# Patient Record
Sex: Male | Born: 1990 | Race: White | Hispanic: No | Marital: Single | Smoking: Never smoker
Health system: Southern US, Community
[De-identification: ages and names within clinical notes are randomized; demographics above are authoritative.]

---
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
## 2010-11-08 ENCOUNTER — Ambulatory Visit: Payer: Self-pay | Admitting: Internal Medicine

## 2014-02-02 ENCOUNTER — Ambulatory Visit: Payer: Self-pay | Admitting: Physician Assistant

## 2017-03-13 ENCOUNTER — Encounter: Payer: Self-pay | Admitting: Gynecology

## 2017-03-13 ENCOUNTER — Ambulatory Visit
Admission: EM | Admit: 2017-03-13 | Discharge: 2017-03-13 | Disposition: A | Discharge status: Home / Self Care | Payer: 59 | Attending: Internal Medicine | Admitting: Internal Medicine

## 2017-03-13 DIAGNOSIS — S60032A Contusion of left middle finger without damage to nail, initial encounter: Secondary | ICD-10-CM | POA: Diagnosis not present

## 2017-03-13 DIAGNOSIS — S60022A Contusion of left index finger without damage to nail, initial encounter: Secondary | ICD-10-CM | POA: Diagnosis not present

## 2017-03-13 MED ORDER — NAPROXEN 500 MG PO TABS: 500.0000 mg | ORAL_TABLET | Freq: Two times a day (BID) | ORAL | 0 refills | Status: DC

## 2017-03-13 NOTE — ED Triage Notes (Signed)
Left index finger injury from playing basketball x 2 weeks ago.

## 2017-03-13 NOTE — ED Provider Notes (Signed)
CSN: [id number]     Arrival date & time 03/13/17  1008 History   None    Chief Complaint  Patient presents with  . Finger Injury   (Consider location/radiation/quality/duration/timing/severity/associated sxs/prior Treatment) Pt was playing basketball approx 3 weeks ago and the ball hit his lt hand causing pain to LIF and LMF. States that he had a finger splint at home that he was using to immobilize and still has some swelling and discomfort. Has not taken anything pta.       History reviewed. No pertinent past medical history. History reviewed. No pertinent surgical history. No family history on file. Social History  Substance Use Topics  . Smoking status: Never Smoker  . Smokeless tobacco: Never Used  . Alcohol use Yes     Comment: social    Review of Systems  Constitutional: Negative.   Respiratory: Negative.   Cardiovascular: Negative.   Musculoskeletal: Positive for joint swelling.       LIF, LMF pain and swelling   Skin:       Swelling to LIF, LMF   Neurological: Negative.     Allergies  Patient has no allergy information on record.  Home Medications   Prior to Admission medications   Medication Sig Start Date End Date Taking? Authorizing Provider  naproxen (NAPROSYN) 500 MG tablet Take 1 tablet (500 mg total) by mouth 2 (two) times daily. 03/13/17   Tobi BastosMitchell, Draper Gallon A, NP   Meds Ordered and Administered this Visit  Medications - No data to display  BP 113/75 (BP Location: Left Arm)   Pulse 68   Temp 98.6 F (37 C) (Oral)   Resp 16   Ht 5\' 10"  (1.778 m)   Wt 180 lb (81.6 kg)   SpO2 100%   BMI 25.83 kg/m  No data found.   Physical Exam  Constitutional: He appears well-developed.  Cardiovascular: Normal rate and regular rhythm.   Pulmonary/Chest: Effort normal and breath sounds normal.  Musculoskeletal: He exhibits edema and tenderness.  LIF, LMF mild amount of edema near the base of joint. Full ROM tenderness near base. Warm to palpation, strong  distal pulse.   Neurological: He is alert.  Skin: Skin is warm. Capillary refill takes less than 2 seconds.    Urgent Care Course     Procedures (including critical care time)  Labs Review Labs Reviewed - No data to display  Imaging Review No results found.           MDM   1. Contusion of left index finger without damage to nail, initial encounter   2. Contusion of left middle finger without damage to nail, initial encounter    May use ice and heat as needed for pain  Pain meds prn  Begin ROM and if pain persist to follow up with ortho      Tobi BastosMitchell, Allegra Cerniglia A, NP 03/13/17 1057

## 2017-03-13 NOTE — Discharge Instructions (Signed)
May use ice and heat as needed for pain  Pain meds prn  Begin ROM and if pain persist to follow up with ortho

## 2017-04-09 DIAGNOSIS — M24542 Contracture, left hand: Secondary | ICD-10-CM | POA: Insufficient documentation

## 2021-03-28 DIAGNOSIS — Z20822 Contact with and (suspected) exposure to covid-19: Secondary | ICD-10-CM | POA: Diagnosis not present

## 2021-03-28 DIAGNOSIS — R1031 Right lower quadrant pain: Secondary | ICD-10-CM | POA: Diagnosis not present

## 2021-03-28 DIAGNOSIS — R1110 Vomiting, unspecified: Secondary | ICD-10-CM | POA: Diagnosis not present

## 2021-03-28 DIAGNOSIS — R1084 Generalized abdominal pain: Secondary | ICD-10-CM | POA: Diagnosis not present

## 2021-03-28 DIAGNOSIS — Z6827 Body mass index (BMI) 27.0-27.9, adult: Secondary | ICD-10-CM | POA: Diagnosis not present

## 2021-03-28 DIAGNOSIS — R109 Unspecified abdominal pain: Secondary | ICD-10-CM | POA: Diagnosis not present

## 2021-03-29 ENCOUNTER — Other Ambulatory Visit: Payer: Self-pay

## 2021-03-29 ENCOUNTER — Observation Stay: Payer: BC Managed Care – PPO | Admitting: Certified Registered"

## 2021-03-29 ENCOUNTER — Encounter
Admission: EM | Disposition: A | Discharge status: Home / Self Care | Payer: Self-pay | Source: Home / Self Care | Attending: Student in an Organized Health Care Education/Training Program

## 2021-03-29 ENCOUNTER — Emergency Department: Payer: BC Managed Care – PPO

## 2021-03-29 ENCOUNTER — Observation Stay
Admission: EM | Admit: 2021-03-29 | Discharge: 2021-03-30 | Disposition: A | Discharge status: Home / Self Care | Payer: BC Managed Care – PPO | Attending: General Surgery | Admitting: General Surgery

## 2021-03-29 DIAGNOSIS — R160 Hepatomegaly, not elsewhere classified: Secondary | ICD-10-CM | POA: Diagnosis not present

## 2021-03-29 DIAGNOSIS — R188 Other ascites: Secondary | ICD-10-CM | POA: Diagnosis not present

## 2021-03-29 DIAGNOSIS — K760 Fatty (change of) liver, not elsewhere classified: Secondary | ICD-10-CM | POA: Diagnosis not present

## 2021-03-29 DIAGNOSIS — K3531 Acute appendicitis with localized peritonitis and gangrene, without perforation: Secondary | ICD-10-CM

## 2021-03-29 DIAGNOSIS — Z9049 Acquired absence of other specified parts of digestive tract: Secondary | ICD-10-CM

## 2021-03-29 DIAGNOSIS — K3532 Acute appendicitis with perforation and localized peritonitis, without abscess: Principal | ICD-10-CM | POA: Insufficient documentation

## 2021-03-29 DIAGNOSIS — R1031 Right lower quadrant pain: Secondary | ICD-10-CM | POA: Diagnosis not present

## 2021-03-29 DIAGNOSIS — K3580 Unspecified acute appendicitis: Secondary | ICD-10-CM | POA: Diagnosis present

## 2021-03-29 DIAGNOSIS — K353 Acute appendicitis with localized peritonitis, without perforation or gangrene: Secondary | ICD-10-CM | POA: Diagnosis not present

## 2021-03-29 DIAGNOSIS — K37 Unspecified appendicitis: Secondary | ICD-10-CM | POA: Diagnosis not present

## 2021-03-29 HISTORY — PX: LAPAROSCOPIC APPENDECTOMY: SHX408

## 2021-03-29 LAB — COMPREHENSIVE METABOLIC PANEL
ALT: 34 U/L (ref 0–44)
AST: 27 U/L (ref 15–41)
Albumin: 4.3 g/dL (ref 3.5–5.0)
Alkaline Phosphatase: 69 U/L (ref 38–126)
Anion gap: 9 (ref 5–15)
BUN: 11 mg/dL (ref 6–20)
CO2: 25 mmol/L (ref 22–32)
Calcium: 9.1 mg/dL (ref 8.9–10.3)
Chloride: 103 mmol/L (ref 98–111)
Creatinine, Ser: 0.89 mg/dL (ref 0.61–1.24)
GFR, Estimated: >60 mL/min (ref >60)
Glucose, Bld: 112 mg/dL — ABNORMAL HIGH (ref 70–99)
Potassium: 3.9 mmol/L (ref 3.5–5.1)
Sodium: 137 mmol/L (ref 135–145)
Total Bilirubin: 1.3 mg/dL — ABNORMAL HIGH (ref 0.3–1.2)
Total Protein: 7.3 g/dL (ref 6.5–8.1)

## 2021-03-29 LAB — URINALYSIS, COMPLETE (UACMP) WITH MICROSCOPIC
Bacteria, UA: NONE SEEN
Bilirubin Urine: NEGATIVE
Glucose, UA: NEGATIVE mg/dL
Hgb urine dipstick: NEGATIVE
Ketones, ur: 20 mg/dL — AB
Leukocytes,Ua: NEGATIVE
Nitrite: NEGATIVE
Protein, ur: NEGATIVE mg/dL
Specific Gravity, Urine: 1.03 (ref 1.005–1.030)
pH: 6 (ref 5.0–8.0)

## 2021-03-29 LAB — LIPASE, BLOOD: Lipase: 27 U/L (ref 11–51)

## 2021-03-29 LAB — CBC WITH DIFFERENTIAL/PLATELET
Abs Immature Granulocytes: 0.05 10*3/uL (ref 0.00–0.07)
Basophils Absolute: 0 10*3/uL (ref 0.0–0.1)
Basophils Relative: 0 %
Eosinophils Absolute: 0 10*3/uL (ref 0.0–0.5)
Eosinophils Relative: 0 %
HCT: 41.5 % (ref 39.0–52.0)
Hemoglobin: 14.2 g/dL (ref 13.0–17.0)
Immature Granulocytes: 1 %
Lymphocytes Relative: 8 %
Lymphs Abs: 0.8 10*3/uL (ref 0.7–4.0)
MCH: 29.6 pg (ref 26.0–34.0)
MCHC: 34.2 g/dL (ref 30.0–36.0)
MCV: 86.5 fL (ref 80.0–100.0)
Monocytes Absolute: 1 10*3/uL (ref 0.1–1.0)
Monocytes Relative: 10 %
Neutro Abs: 8.7 10*3/uL — ABNORMAL HIGH (ref 1.7–7.7)
Neutrophils Relative %: 81 %
Platelets: 203 10*3/uL (ref 150–400)
RBC: 4.8 MIL/uL (ref 4.22–5.81)
RDW: 14.2 % (ref 11.5–15.5)
WBC: 10.6 10*3/uL — ABNORMAL HIGH (ref 4.0–10.5)
nRBC: 0 % (ref 0.0–0.2)

## 2021-03-29 SURGERY — APPENDECTOMY, LAPAROSCOPIC
Anesthesia: General | Site: Abdomen

## 2021-03-29 MED ORDER — ONDANSETRON 4 MG PO TBDP
4.0000 mg | ORAL_TABLET | Freq: Once | ORAL | Status: AC
  Administered 2021-03-29: 4 mg via ORAL
  Filled 2021-03-29: qty 1

## 2021-03-29 MED ORDER — SUGAMMADEX SODIUM 200 MG/2ML IV SOLN
INTRAVENOUS | Status: DC | PRN
  Administered 2021-03-29: 200 mg via INTRAVENOUS

## 2021-03-29 MED ORDER — DEXMEDETOMIDINE (PRECEDEX) IN NS 20 MCG/5ML (4 MCG/ML) IV SYRINGE
PREFILLED_SYRINGE | INTRAVENOUS | Status: DC | PRN
  Administered 2021-03-29: 20 ug via INTRAVENOUS

## 2021-03-29 MED ORDER — KETOROLAC TROMETHAMINE 30 MG/ML IJ SOLN: 30.0000 mg | Freq: Four times a day (QID) | INTRAMUSCULAR | Status: DC | PRN

## 2021-03-29 MED ORDER — LIDOCAINE HCL (CARDIAC) PF 100 MG/5ML IV SOSY
PREFILLED_SYRINGE | INTRAVENOUS | Status: DC | PRN
  Administered 2021-03-29: 100 mg via INTRAVENOUS

## 2021-03-29 MED ORDER — FENTANYL CITRATE (PF) 100 MCG/2ML IJ SOLN
INTRAMUSCULAR | Status: AC
  Filled 2021-03-29: qty 2

## 2021-03-29 MED ORDER — PROPOFOL 10 MG/ML IV BOLUS
INTRAVENOUS | Status: DC | PRN
  Administered 2021-03-29: 150 mg via INTRAVENOUS

## 2021-03-29 MED ORDER — ONDANSETRON HCL 4 MG/2ML IJ SOLN
4.0000 mg | Freq: Once | INTRAMUSCULAR | Status: AC
  Administered 2021-03-29: 4 mg via INTRAVENOUS
  Filled 2021-03-29: qty 2

## 2021-03-29 MED ORDER — MIDAZOLAM HCL 2 MG/2ML IJ SOLN
INTRAMUSCULAR | Status: DC | PRN
  Administered 2021-03-29: 2 mg via INTRAVENOUS

## 2021-03-29 MED ORDER — DEXTROSE IN LACTATED RINGERS 5 % IV SOLN: INTRAVENOUS | Status: DC

## 2021-03-29 MED ORDER — KETOROLAC TROMETHAMINE 30 MG/ML IJ SOLN: 30.0000 mg | Freq: Once | INTRAMUSCULAR | Status: DC | PRN

## 2021-03-29 MED ORDER — SODIUM CHLORIDE 0.9 % IR SOLN
Status: DC | PRN
  Administered 2021-03-29: 1000 mL

## 2021-03-29 MED ORDER — SUCCINYLCHOLINE CHLORIDE 20 MG/ML IJ SOLN
INTRAMUSCULAR | Status: DC | PRN
  Administered 2021-03-29: 120 mg via INTRAVENOUS

## 2021-03-29 MED ORDER — KETOROLAC TROMETHAMINE 30 MG/ML IJ SOLN
INTRAMUSCULAR | Status: DC | PRN
  Administered 2021-03-29: 30 mg via INTRAVENOUS

## 2021-03-29 MED ORDER — OXYCODONE HCL 5 MG/5ML PO SOLN: 5.0000 mg | Freq: Once | ORAL | Status: DC | PRN

## 2021-03-29 MED ORDER — OXYCODONE HCL 5 MG PO TABS
5.0000 mg | ORAL_TABLET | ORAL | Status: DC | PRN
Start: 2021-03-29 — End: 2021-03-30
  Administered 2021-03-30: 10 mg via ORAL
  Filled 2021-03-29: qty 2

## 2021-03-29 MED ORDER — ONDANSETRON HCL 4 MG/2ML IJ SOLN: 4.0000 mg | Freq: Four times a day (QID) | INTRAMUSCULAR | Status: DC | PRN

## 2021-03-29 MED ORDER — ROCURONIUM BROMIDE 10 MG/ML (PF) SYRINGE
PREFILLED_SYRINGE | INTRAVENOUS | Status: AC
  Filled 2021-03-29: qty 10

## 2021-03-29 MED ORDER — DEXAMETHASONE SODIUM PHOSPHATE 10 MG/ML IJ SOLN
INTRAMUSCULAR | Status: DC | PRN
  Administered 2021-03-29: 5 mg via INTRAVENOUS

## 2021-03-29 MED ORDER — FENTANYL CITRATE (PF) 100 MCG/2ML IJ SOLN
INTRAMUSCULAR | Status: DC | PRN
  Administered 2021-03-29 (×2): 50 ug via INTRAVENOUS
  Administered 2021-03-29: 100 ug via INTRAVENOUS

## 2021-03-29 MED ORDER — SUCCINYLCHOLINE CHLORIDE 200 MG/10ML IV SOSY
PREFILLED_SYRINGE | INTRAVENOUS | Status: AC
  Filled 2021-03-29: qty 10

## 2021-03-29 MED ORDER — DEXMEDETOMIDINE (PRECEDEX) IN NS 20 MCG/5ML (4 MCG/ML) IV SYRINGE
PREFILLED_SYRINGE | INTRAVENOUS | Status: AC
  Filled 2021-03-29: qty 5

## 2021-03-29 MED ORDER — MIDAZOLAM HCL 2 MG/2ML IJ SOLN
INTRAMUSCULAR | Status: AC
  Filled 2021-03-29: qty 2

## 2021-03-29 MED ORDER — CELECOXIB 200 MG PO CAPS
200.0000 mg | ORAL_CAPSULE | Freq: Two times a day (BID) | ORAL | Status: DC
  Administered 2021-03-30: 200 mg via ORAL
  Filled 2021-03-29 (×3): qty 1

## 2021-03-29 MED ORDER — KETOROLAC TROMETHAMINE 30 MG/ML IJ SOLN
INTRAMUSCULAR | Status: AC
  Filled 2021-03-29: qty 1

## 2021-03-29 MED ORDER — OXYCODONE-ACETAMINOPHEN 5-325 MG PO TABS
1.0000 | ORAL_TABLET | Freq: Once | ORAL | Status: AC
  Administered 2021-03-29: 1 via ORAL
  Filled 2021-03-29: qty 1

## 2021-03-29 MED ORDER — SIMETHICONE 40 MG/0.6ML PO SUSP
40.0000 mg | Freq: Four times a day (QID) | ORAL | Status: DC | PRN
  Filled 2021-03-29: qty 0.6

## 2021-03-29 MED ORDER — LACTATED RINGERS IV SOLN: INTRAVENOUS | Status: DC | PRN

## 2021-03-29 MED ORDER — ONDANSETRON 4 MG PO TBDP: 4.0000 mg | ORAL_TABLET | Freq: Four times a day (QID) | ORAL | Status: DC | PRN

## 2021-03-29 MED ORDER — ACETAMINOPHEN 500 MG PO TABS
1000.0000 mg | ORAL_TABLET | Freq: Four times a day (QID) | ORAL | Status: DC
  Administered 2021-03-30 (×3): 1000 mg via ORAL
  Filled 2021-03-29 (×3): qty 2

## 2021-03-29 MED ORDER — OXYCODONE HCL 5 MG PO TABS: 5.0000 mg | ORAL_TABLET | Freq: Once | ORAL | Status: DC | PRN

## 2021-03-29 MED ORDER — OXYCODONE HCL 5 MG PO TABS: 5.0000 mg | ORAL_TABLET | ORAL | Status: DC | PRN

## 2021-03-29 MED ORDER — HYDROMORPHONE HCL 1 MG/ML IJ SOLN: 0.5000 mg | INTRAMUSCULAR | Status: DC | PRN

## 2021-03-29 MED ORDER — METRONIDAZOLE IVPB CUSTOM
1500.0000 mg | Freq: Once | INTRAVENOUS | Status: AC
  Administered 2021-03-29: 1500 mg via INTRAVENOUS
  Filled 2021-03-29: qty 300

## 2021-03-29 MED ORDER — IBUPROFEN 600 MG PO TABS: 600.0000 mg | ORAL_TABLET | Freq: Four times a day (QID) | ORAL | Status: DC | PRN

## 2021-03-29 MED ORDER — ONDANSETRON HCL 4 MG/2ML IJ SOLN
INTRAMUSCULAR | Status: AC
  Filled 2021-03-29: qty 2

## 2021-03-29 MED ORDER — LIDOCAINE HCL (PF) 2 % IJ SOLN
INTRAMUSCULAR | Status: AC
  Filled 2021-03-29: qty 5

## 2021-03-29 MED ORDER — MORPHINE SULFATE (PF) 4 MG/ML IV SOLN
4.0000 mg | Freq: Once | INTRAVENOUS | Status: AC
  Administered 2021-03-29: 4 mg via INTRAVENOUS
  Filled 2021-03-29: qty 1

## 2021-03-29 MED ORDER — PROPOFOL 10 MG/ML IV BOLUS
INTRAVENOUS | Status: AC
  Filled 2021-03-29: qty 20

## 2021-03-29 MED ORDER — FENTANYL CITRATE (PF) 100 MCG/2ML IJ SOLN: 25.0000 ug | INTRAMUSCULAR | Status: DC | PRN

## 2021-03-29 MED ORDER — ACETAMINOPHEN 500 MG PO TABS: 1000.0000 mg | ORAL_TABLET | Freq: Four times a day (QID) | ORAL | Status: DC

## 2021-03-29 MED ORDER — SODIUM CHLORIDE 0.9 % IV BOLUS
1000.0000 mL | Freq: Once | INTRAVENOUS | Status: AC
  Administered 2021-03-29: 1000 mL via INTRAVENOUS

## 2021-03-29 MED ORDER — ONDANSETRON HCL 4 MG/2ML IJ SOLN: 4.0000 mg | Freq: Once | INTRAMUSCULAR | Status: DC | PRN

## 2021-03-29 MED ORDER — ROCURONIUM BROMIDE 100 MG/10ML IV SOLN
INTRAVENOUS | Status: DC | PRN
  Administered 2021-03-29: 30 mg via INTRAVENOUS

## 2021-03-29 MED ORDER — HEMOSTATIC AGENTS (NO CHARGE) OPTIME
TOPICAL | Status: DC | PRN
  Administered 2021-03-29: 1 via TOPICAL

## 2021-03-29 MED ORDER — ONDANSETRON HCL 4 MG/2ML IJ SOLN
INTRAMUSCULAR | Status: DC | PRN
  Administered 2021-03-29: 4 mg via INTRAVENOUS

## 2021-03-29 MED ORDER — ACETAMINOPHEN 500 MG PO TABS
1000.0000 mg | ORAL_TABLET | Freq: Once | ORAL | Status: AC
  Administered 2021-03-29: 1000 mg via ORAL
  Filled 2021-03-29: qty 2

## 2021-03-29 MED ORDER — DEXAMETHASONE SODIUM PHOSPHATE 10 MG/ML IJ SOLN
INTRAMUSCULAR | Status: AC
  Filled 2021-03-29: qty 1

## 2021-03-29 MED ORDER — 0.9 % SODIUM CHLORIDE (POUR BTL) OPTIME
TOPICAL | Status: DC | PRN
  Administered 2021-03-29: 1000 mL

## 2021-03-29 MED ORDER — SODIUM CHLORIDE 0.9 % IV SOLN
1.0000 g | Freq: Once | INTRAVENOUS | Status: AC
  Administered 2021-03-29: 1 g via INTRAVENOUS
  Filled 2021-03-29: qty 10

## 2021-03-29 MED ORDER — GABAPENTIN 300 MG PO CAPS
300.0000 mg | ORAL_CAPSULE | Freq: Two times a day (BID) | ORAL | Status: DC
  Administered 2021-03-30: 300 mg via ORAL
  Filled 2021-03-29: qty 1

## 2021-03-29 MED ORDER — LIDOCAINE-EPINEPHRINE 1 %-1:100000 IJ SOLN
INTRAMUSCULAR | Status: DC | PRN
  Administered 2021-03-29: 12 mL

## 2021-03-29 SURGICAL SUPPLY — 54 items
APPLICATOR COTTON TIP 6 STRL (MISCELLANEOUS) ×1 IMPLANT
APPLICATOR COTTON TIP 6IN STRL (MISCELLANEOUS) ×2 IMPLANT
APPLIER CLIP 5 13 M/L LIGAMAX5 (MISCELLANEOUS) ×2
BLADE CLIPPER SURG (BLADE) ×2 IMPLANT
BLADE SURG SZ11 CARB STEEL (BLADE) ×2 IMPLANT
CHLORAPREP W/TINT 26 (MISCELLANEOUS) ×2 IMPLANT
CLIP APPLIE 5 13 M/L LIGAMAX5 (MISCELLANEOUS) ×1 IMPLANT
CUTTER FLEX LINEAR 45M (STAPLE) ×2 IMPLANT
DEFOGGER SCOPE WARMER CLEARIFY (MISCELLANEOUS) ×2 IMPLANT
DERMABOND ADVANCED (GAUZE/BANDAGES/DRESSINGS) ×1
DERMABOND ADVANCED .7 DNX12 (GAUZE/BANDAGES/DRESSINGS) ×1 IMPLANT
ELECT CAUTERY BLADE TIP 2.5 (TIP) ×2
ELECT COATED BLADE 2.86 ST (ELECTRODE) ×2 IMPLANT
ELECT REM PT RETURN 9FT ADLT (ELECTROSURGICAL) ×2
ELECTRODE CAUTERY BLDE TIP 2.5 (TIP) ×1 IMPLANT
ELECTRODE REM PT RTRN 9FT ADLT (ELECTROSURGICAL) ×1 IMPLANT
GAUZE 4X4 16PLY ~~LOC~~+RFID DBL (SPONGE) ×2 IMPLANT
GLOVE SURG SYN 6.5 ES PF (GLOVE) ×2 IMPLANT
GLOVE SURG UNDER LTX SZ7 (GLOVE) ×2 IMPLANT
GOWN STRL REUS W/ TWL LRG LVL3 (GOWN DISPOSABLE) ×2 IMPLANT
GOWN STRL REUS W/TWL LRG LVL3 (GOWN DISPOSABLE) ×2
GRASPER SUT TROCAR 14GX15 (MISCELLANEOUS) ×2 IMPLANT
HEMOSTAT SURGICEL 2X3 (HEMOSTASIS) ×2 IMPLANT
IRRIGATION STRYKERFLOW (MISCELLANEOUS) ×1 IMPLANT
IRRIGATOR STRYKERFLOW (MISCELLANEOUS) ×2
IV NS 1000ML (IV SOLUTION) ×1
IV NS 1000ML BAXH (IV SOLUTION) ×1 IMPLANT
KIT TURNOVER KIT A (KITS) ×2 IMPLANT
KITTNER LAPARASCOPIC 5X40 (MISCELLANEOUS) ×2 IMPLANT
LABEL OR SOLS (LABEL) ×2 IMPLANT
MANIFOLD NEPTUNE II (INSTRUMENTS) ×2 IMPLANT
NEEDLE HYPO 22GX1.5 SAFETY (NEEDLE) ×2 IMPLANT
NS IRRIG 500ML POUR BTL (IV SOLUTION) ×2 IMPLANT
PACK LAP CHOLECYSTECTOMY (MISCELLANEOUS) ×2 IMPLANT
PENCIL ELECTRO HAND CTR (MISCELLANEOUS) ×2 IMPLANT
POUCH SPECIMEN RETRIEVAL 10MM (ENDOMECHANICALS) ×2 IMPLANT
RELOAD STAPLE TA45 3.5 REG BLU (ENDOMECHANICALS) ×2 IMPLANT
SCISSORS METZENBAUM CVD 33 (INSTRUMENTS) ×2 IMPLANT
SET TUBE SMOKE EVAC HIGH FLOW (TUBING) ×2 IMPLANT
SHEARS HARMONIC ACE PLUS 36CM (ENDOMECHANICALS) ×2 IMPLANT
SLEEVE ADV FIXATION 5X100MM (TROCAR) ×2 IMPLANT
STRIP CLOSURE SKIN 1/2X4 (GAUZE/BANDAGES/DRESSINGS) ×2 IMPLANT
STRIP CLOSURE SKIN 1/4X4 (GAUZE/BANDAGES/DRESSINGS) ×2 IMPLANT
SUT MNCRL 4-0 (SUTURE) ×1
SUT MNCRL 4-0 27XMFL (SUTURE) ×1
SUT VIC AB 3-0 SH 27 (SUTURE) ×1
SUT VIC AB 3-0 SH 27X BRD (SUTURE) ×1 IMPLANT
SUT VICRYL 0 AB UR-6 (SUTURE) ×2 IMPLANT
SUTURE MNCRL 4-0 27XMF (SUTURE) ×1 IMPLANT
SYS KII FIOS ACCESS ABD 5X100 (TROCAR) ×2
SYSTEM KII FIOS ACES ABD 5X100 (TROCAR) ×1 IMPLANT
TRAY FOLEY MTR SLVR 16FR STAT (SET/KITS/TRAYS/PACK) ×2 IMPLANT
TROCAR ADV FIXATION 12X100MM (TROCAR) IMPLANT
TROCAR BALLN GELPORT 12X130M (ENDOMECHANICALS) ×2 IMPLANT

## 2021-03-29 NOTE — Anesthesia Procedure Notes (Signed)
Procedure Name: Intubation Date/Time: 03/29/2021 5:56 PM Performed by: Garner Nash, CRNA Pre-anesthesia Checklist: Patient identified, Emergency Drugs available, Suction available and Patient being monitored Patient Re-evaluated:Patient Re-evaluated prior to induction Oxygen Delivery Method: Circle system utilized Preoxygenation: Pre-oxygenation with 100% oxygen Induction Type: IV induction Ventilation: Mask ventilation without difficulty Laryngoscope Size: Mac and 3 Grade View: Grade I Tube type: Oral Nasal Tubes: Right Tube size: 7.5 mm Number of attempts: 1 Airway Equipment and Method: Stylet and Oral airway Placement Confirmation: ETT inserted through vocal cords under direct vision, positive ETCO2 and breath sounds checked- equal and bilateral Tube secured with: Tape Dental Injury: Teeth and Oropharynx as per pre-operative assessment

## 2021-03-29 NOTE — Progress Notes (Signed)
Called to given 15 minute to floor

## 2021-03-29 NOTE — Transfer of Care (Signed)
Immediate Anesthesia Transfer of Care Note  Patient: Keith Cortez  Procedure(s) Performed: APPENDECTOMY LAPAROSCOPIC (Abdomen)  Patient Location: PACU  Anesthesia Type:General  Level of Consciousness: drowsy  Airway & Oxygen Therapy: Patient Spontanous Breathing  Post-op Assessment: Report given to RN  Post vital signs: stable  Last Vitals:  Vitals Value Taken Time  BP 117/71 03/29/21 1900  Temp    Pulse 82 03/29/21 1901  Resp 22 03/29/21 1901  SpO2 97 % 03/29/21 1901  Vitals shown include unvalidated device data.  Last Pain:  Vitals:   03/29/21 1718  TempSrc: Tympanic  PainSc: 3          Complications: No notable events documented.

## 2021-03-29 NOTE — Anesthesia Preprocedure Evaluation (Addendum)
Anesthesia Evaluation  Patient identified by MRN, date of birth, ID band Patient awake    Airway Mallampati: I  TM Distance: >3 FB Neck ROM: Full    Dental   Pulmonary Current Smoker,           Cardiovascular negative cardio ROS       Neuro/Psych negative neurological ROS     GI/Hepatic Neg liver ROS, appendicitis   Endo/Other  negative endocrine ROS  Renal/GU negative Renal ROS     Musculoskeletal negative musculoskeletal ROS (+)   Abdominal   Peds  Hematology negative hematology ROS (+)   Anesthesia Other Findings   Reproductive/Obstetrics                            Anesthesia Physical Anesthesia Plan  ASA: 2 and emergent  Anesthesia Plan: General   Post-op Pain Management:    Induction: Intravenous  PONV Risk Score and Plan:   Airway Management Planned: Oral ETT  Additional Equipment:   Intra-op Plan:   Post-operative Plan: Extubation in OR  Informed Consent: I have reviewed the patients History and Physical, chart, labs and discussed the procedure including the risks, benefits and alternatives for the proposed anesthesia with the patient or authorized representative who has indicated his/her understanding and acceptance.     Dental advisory given  Plan Discussed with:   Anesthesia Plan Comments: (OSA score 1)      Anesthesia Quick Evaluation

## 2021-03-29 NOTE — ED Triage Notes (Signed)
Pt states that he began to have pain in his lower right abd yesterday morning, pt states pain is in back a little bit, pt states he feels achy all over. Pain is worse when moving. Pt also has n/v.

## 2021-03-29 NOTE — Op Note (Signed)
Operative Note  Laparoscopic Appendectomy   Keith Cortez Date of operation:  03/29/2021  Indications: The patient presented with a history of  abdominal pain. Workup has revealed findings consistent with acute appendicitis.  Pre-operative Diagnosis: Other appendicitis  Post-operative Diagnosis: Same  Surgeon: Duanne Guess, MD  Anesthesia: GETA  Findings: Inflamed retrocecal appendix without perforation or abscess  Estimated Blood Loss: Less than 5 cc         Specimens: appendix         Complications: None immediately apparent  Procedure Details  The patient was seen again in the preop area. The options of surgery versus observation were reviewed with the patient and/or family. The risks of bleeding, infection, recurrence of symptoms, negative laparoscopy, potential for an open procedure, bowel injury, abscess or infection, were all reviewed as well. The patient was taken to Operating Room, identified as Keith Cortez and the procedure verified as laparoscopic appendectomy. A time out was performed and the above information confirmed.  The patient was placed in the supine position and general anesthesia was induced.  Antibiotic prophylaxis was administered and VTE prophylaxis was in place. A Foley catheter was placed by the nursing staff.   The abdomen was prepped and draped in a sterile fashion.  A supraumbilical incision was made. A cutdown technique was used to enter the abdominal cavity. Two vicryl stitches were placed on the fascia and a Hasson trocar inserted. Pneumoperitoneum obtained. Two 5 mm ports were placed under direct visualization.  The appendix was identified and found to be acutely inflamed, located retrocecally, without perforation or abscess. The appendix was carefully dissected away from the surrounding tissues.  The mesoappendix was divided with the harmonic scalpel. The base of the appendix was dissected out and divided with a standard load Endo GIA.The appendix  was placed in a Endo Catch bag and removed via the Hasson port. The right lower quadrant and pelvis was then irrigated with normal saline which was then aspirated. The right lower quadrant was inspected there was no sign of bleeding or bowel injury therefore pneumoperitoneum was released, all ports were removed.  The umbilical fascia was closed with 0 Vicryl interrupted sutures and the skin incisions were approximated with subcuticular 4-0 Monocryl. Dermabond was applied The patient tolerated the procedure well and there were no immediately apparent complications. The sponge lap and needle count were correct at the end of the procedure.  The patient was taken to the recovery room in stable condition to be admitted for continued care.   Duanne Guess, MD, FACS

## 2021-03-29 NOTE — ED Notes (Signed)
Pt taken by OR tech

## 2021-03-29 NOTE — Anesthesia Postprocedure Evaluation (Signed)
Anesthesia Post Note  Patient: Keith Cortez  Procedure(s) Performed: APPENDECTOMY LAPAROSCOPIC (Abdomen)  Patient location during evaluation: PACU Anesthesia Type: General Level of consciousness: awake, oriented and awake and alert Pain management: pain level controlled Vital Signs Assessment: post-procedure vital signs reviewed and stable Respiratory status: spontaneous breathing and nonlabored ventilation Cardiovascular status: blood pressure returned to baseline Postop Assessment: no headache, no backache, patient able to bend at knees and no apparent nausea or vomiting Anesthetic complications: no   No notable events documented.   Last Vitals:  Vitals:   03/29/21 1945 03/29/21 2000  BP: 133/72 115/74  Pulse: 84 77  Resp: (!) 23 20  Temp:    SpO2: 93% 95%    Last Pain:  Vitals:   03/29/21 1945  TempSrc:   PainSc: 0-No pain                 Johny Drilling

## 2021-03-29 NOTE — ED Notes (Signed)
Per Dr. Dolores Frame no orders for imaging at this time, pt was seen at other hospital yesterday with normal CT scan.

## 2021-03-29 NOTE — ED Provider Notes (Signed)
[facility] Emergency Department Provider Note ____________________________________________   Event Date/Time   First MD Initiated Contact with Patient 03/29/21 0813     (approximate)  I have reviewed the triage vital signs and the nursing notes.   HISTORY  Chief Complaint Abdominal Pain  HPI Keith Cortez is a 30 y.o. male with no significant past medical history presents to the emergency department for treatment and evaluation of right lower quadrant abdominal pain with nausea, vomiting, and fever that started last night.  He reports that any bouncing or moving was excruciating until he received pain medication once he got here.  No alleviating measures attempted prior to arrival.      History reviewed. No pertinent past medical history.  There are no problems to display for this patient.   History reviewed. No pertinent surgical history.  Prior to Admission medications   Medication Sig Start Date End Date Taking? Authorizing Provider  naproxen (NAPROSYN) 500 MG tablet Take 1 tablet (500 mg total) by mouth 2 (two) times daily. 03/13/17   Coralyn Mark, NP    Allergies Patient has no allergy information on record.  No family history on file.  Social History Social History   Tobacco Use   Smoking status: Never   Smokeless tobacco: Never  Substance Use Topics   Alcohol use: Yes    Comment: social   Drug use: No    Review of Systems  Constitutional: Positive for fever chills Eyes: No visual changes. ENT: No sore throat. Cardiovascular: Denies chest pain. Respiratory: Denies shortness of breath. Gastrointestinal: Positive for nausea, vomiting, and abdominal pain.  Negative for diarrhea or constipation  genitourinary: Negative for dysuria. Musculoskeletal: Negative for back pain. Skin: Negative for rash. Neurological: Negative for headaches, focal weakness or numbness.  ____________________________________________   PHYSICAL  EXAM:  VITAL SIGNS: ED Triage Vitals  Enc Vitals Group     BP 03/29/21 0245 131/86     Pulse Rate 03/29/21 0245 97     Resp 03/29/21 0245 18     Temp 03/29/21 0245 99.3 F (37.4 C)     Temp Source 03/29/21 0245 Oral     SpO2 03/29/21 0245 98 %     Weight 03/29/21 0244 185 lb (83.9 kg)     Height 03/29/21 0244 5\' 10"  (1.778 m)     Head Circumference --      Peak Flow --      Pain Score 03/29/21 0243 8     Pain Loc --      Pain Edu? --      Excl. in GC? --     Constitutional: Alert and oriented.  Acutely ill appearing and in no acute distress. Eyes: Conjunctivae are normal.  Head: Atraumatic. Nose: No congestion/rhinnorhea. Mouth/Throat: Mucous membranes are moist.  Oropharynx non-erythematous. Neck: No stridor.   Hematological/Lymphatic/Immunilogical: No cervical lymphadenopathy. Cardiovascular: Normal rate, regular rhythm. Grossly normal heart sounds.  Good peripheral circulation. Respiratory: Normal respiratory effort.  No retractions. Lungs CTAB. Gastrointestinal: Soft with rebound tenderness in the right lower quadrant.tenderness with mild palpation around the umbilicus  genitourinary:  Musculoskeletal: No lower extremity tenderness nor edema.  No joint effusions. Neurologic:  Normal speech and language. No gross focal neurologic deficits are appreciated. No gait instability. Skin:  Skin is warm, dry and intact. No rash noted. Psychiatric: Mood and affect are normal. Speech and behavior are normal.  ____________________________________________   LABS (all labs ordered are listed, but only abnormal results are displayed)  Labs Reviewed  CBC WITH DIFFERENTIAL/PLATELET - Abnormal; Notable for the following components:      Result Value   WBC 10.6 (*)    Neutro Abs 8.7 (*)    All other components within normal limits  COMPREHENSIVE METABOLIC PANEL - Abnormal; Notable for the following components:   Glucose, Bld 112 (*)    Total Bilirubin 1.3 (*)    All other  components within normal limits  URINALYSIS, COMPLETE (UACMP) WITH MICROSCOPIC - Abnormal; Notable for the following components:   Color, Urine YELLOW (*)    APPearance HAZY (*)    Ketones, ur 20 (*)    All other components within normal limits  LIPASE, BLOOD   ____________________________________________  EKG  Not indicated ____________________________________________  RADIOLOGY  ED MD interpretation:    CT abdomen and pelvis shows a dilated appendix with a periappendiceal edema/inflammation and likely trace periappendiceal fluid consistent with acute appendicitis.  Because of the adjacent fluid, perforation cannot be excluded.  I, Kem Boroughs, personally viewed and evaluated these images (plain radiographs) as part of my medical decision making, as well as reviewing the written report by the radiologist.  Official radiology report(s): CT ABDOMEN PELVIS WO CONTRAST  Result Date: 03/29/2021 CLINICAL DATA:  Lower right abdominal pain. EXAM: CT ABDOMEN AND PELVIS WITHOUT CONTRAST TECHNIQUE: Multidetector CT imaging of the abdomen and pelvis was performed following the standard protocol without IV contrast. COMPARISON:  None. FINDINGS: Lower chest: Dependent atelectasis. Hepatobiliary: Liver is enlarged 18.6 cm craniocaudal length. The liver shows diffusely decreased attenuation suggesting fat deposition. High attenuation material in the gallbladder lumen is more dense than typically seen for sludge and could be vicarious excretion of contrast material the patient has had a recent contrast infused CT. No intrahepatic or extrahepatic biliary dilation. Pancreas: No focal mass lesion. No dilatation of the main duct. No intraparenchymal cyst. No peripancreatic edema. Spleen: No splenomegaly. No focal mass lesion. Adrenals/Urinary Tract: No adrenal nodule or mass. Kidneys unremarkable. No evidence for hydroureter. The urinary bladder appears normal for the degree of distention. Stomach/Bowel:  Stomach is unremarkable. No gastric wall thickening. No evidence of outlet obstruction. Duodenum is normally positioned as is the ligament of Treitz. No small bowel wall thickening. No small bowel dilatation. The terminal ileum is normal. Appendix is dilated up to 12 mm diameter with periappendiceal edema/inflammation and probably some trace periappendiceal fluid. No gross colonic mass. No colonic wall thickening. Vascular/Lymphatic: No abdominal aortic aneurysm. No abdominal aortic atherosclerotic calcification. There is no gastrohepatic or hepatoduodenal ligament lymphadenopathy. No retroperitoneal or mesenteric lymphadenopathy. No pelvic sidewall lymphadenopathy. Reproductive: The prostate gland and seminal vesicles are unremarkable. Other: Trace free fluid around the appendix. Musculoskeletal: No worrisome lytic or sclerotic osseous abnormality. IMPRESSION: 1. Dilated appendix with periappendiceal edema/inflammation and probably some trace periappendiceal fluid. Imaging features are consistent with acute appendicitis. Given the adjacent fluid, perforation cannot be excluded. No evidence for organized abscess. 2. Hepatomegaly with hepatic steatosis. 3. High attenuation material in the gallbladder lumen is more dense than typically seen for sludge and could be vicarious excretion of contrast material the patient has had a recent contrast infused CT. Electronically Signed   By: Kennith Center M.D.   On: 03/29/2021 10:59    ____________________________________________   PROCEDURES  Procedure(s) performed (including Critical Care):  Procedures  ____________________________________________   INITIAL IMPRESSION / ASSESSMENT AND PLAN     30 year old male presenting to the emergency department for treatment and evaluation of right lower quadrant pain.  See HPI for  further details.  Exam is concerning for appendicitis.  Plan will be to give fluids, nausea and pain medication and get a CT with IV contrast.   Patient aware and agreeable to plan.  DIFFERENTIAL DIAGNOSIS  Acute appendicitis, colitis, viral gastroenteritis  ED COURSE  Patient was evaluated at [facility] and had a CT of the abdomen and pelvis with contrast which was negative for any acute concerns.  He was also negative for COVID and influenza.  Despite negative work-up yesterday, exam is concerning, therefore CT of the abdomen and pelvis without contrast ordered.  ----------------------------------------- 11:36 AM on 03/29/2021 -----------------------------------------  CT confirms suspicion for acute appendicitis.  Dr. Lady Gary with general surgery paged by secretary.  ----------------------------------------- 11:46 AM on 03/29/2021 -----------------------------------------  Discussed with Dr. Lady Gary who will come evaluate the patient.    ___________________________________________   FINAL CLINICAL IMPRESSION(S) / ED DIAGNOSES  Final diagnoses:  Acute appendicitis with perforation and localized peritonitis, unspecified whether abscess present, unspecified whether gangrene present     ED Discharge Orders     None        Keith Cortez was evaluated in Emergency Department on 03/29/2021 for the symptoms described in the history of present illness. He was evaluated in the context of the global COVID-19 pandemic, which necessitated consideration that the patient might be at risk for infection with the SARS-CoV-2 virus that causes COVID-19. Institutional protocols and algorithms that pertain to the evaluation of patients at risk for COVID-19 are in a state of rapid change based on information released by regulatory bodies including the CDC and federal and state organizations. These policies and algorithms were followed during the patient's care in the ED.   Note:  This document was prepared using Dragon voice recognition software and may include unintentional dictation errors.    Chinita Pester, FNP 03/29/21  1307    Willy Eddy, MD 03/29/21 [phone]

## 2021-03-29 NOTE — H&P (Signed)
Keith Cortez is an 30 y.o. male.   Chief Complaint: Acute appendicitis HPI: He presented to the emergency department this morning after a 2-day history of abdominal pain.  Initially, it was generalized/periumbilical and has since migrated to the right lower quadrant.  This is been accompanied by nausea and vomiting.  Also describes subjective fever.  He was actually seen at [facility] in [place] yesterday.  A CT scan was done there that did not show any significant intra-abdominal pathology.  The patient states that his pain became excruciating at about 2:00 this morning and as a result he elected to come to the emergency department here.  Evaluation in the ED included a CT scan that was consistent with acute appendicitis with some periappendiceal fluid.  He also had a mild leukocytosis. General surgery was consulted for further evaluation and management.  He states that he has never had any prior abdominal surgery.  History reviewed. No pertinent past medical history.  History reviewed. No pertinent surgical history.  No family history on file. Social History:  reports that he has never smoked. He has never used smokeless tobacco. He reports current alcohol use. He reports that he does not use drugs.  Allergies: Not on File  (Not in a hospital admission)   Results for orders placed or performed during the hospital encounter of 03/29/21 (from the past 48 hour(s))  Urinalysis, Complete w Microscopic     Status: Abnormal   Collection Time: 03/29/21  2:46 AM  Result Value Ref Range   Color, Urine YELLOW (A) YELLOW   APPearance HAZY (A) CLEAR   Specific Gravity, Urine 1.030 1.005 - 1.030   pH 6.0 5.0 - 8.0   Glucose, UA NEGATIVE NEGATIVE mg/dL   Hgb urine dipstick NEGATIVE NEGATIVE   Bilirubin Urine NEGATIVE NEGATIVE   Ketones, ur 20 (A) NEGATIVE mg/dL   Protein, ur NEGATIVE NEGATIVE mg/dL   Nitrite NEGATIVE NEGATIVE   Leukocytes,Ua NEGATIVE NEGATIVE   RBC / HPF 11-20 0 - 5 RBC/hpf   WBC, UA  0-5 0 - 5 WBC/hpf   Bacteria, UA NONE SEEN NONE SEEN   Squamous Epithelial / LPF 0-5 0 - 5   Mucus PRESENT     Comment: Performed at [facility], [address] [place], [place] [address]  CBC with Differential     Status: Abnormal   Collection Time: 03/29/21  2:47 AM  Result Value Ref Range   WBC 10.6 (H) 4.0 - 10.5 K/uL   RBC 4.80 4.22 - 5.81 MIL/uL   Hemoglobin 14.2 13.0 - 17.0 g/dL   HCT 48.0 16.5 - 53.7 %   MCV 86.5 80.0 - 100.0 fL   MCH 29.6 26.0 - 34.0 pg   MCHC 34.2 30.0 - 36.0 g/dL   RDW 48.2 70.7 - 86.7 %   Platelets 203 150 - 400 K/uL   nRBC 0.0 0.0 - 0.2 %   Neutrophils Relative % 81 %   Neutro Abs 8.7 (H) 1.7 - 7.7 K/uL   Lymphocytes Relative 8 %   Lymphs Abs 0.8 0.7 - 4.0 K/uL   Monocytes Relative 10 %   Monocytes Absolute 1.0 0.1 - 1.0 K/uL   Eosinophils Relative 0 %   Eosinophils Absolute 0.0 0.0 - 0.5 K/uL   Basophils Relative 0 %   Basophils Absolute 0.0 0.0 - 0.1 K/uL   Immature Granulocytes 1 %   Abs Immature Granulocytes 0.05 0.00 - 0.07 K/uL    Comment: Performed at [facility], [address] [place], [place] [address]  Comprehensive metabolic panel     Status: Abnormal   Collection Time: 03/29/21  2:47 AM  Result Value Ref Range   Sodium 137 135 - 145 mmol/L   Potassium 3.9 3.5 - 5.1 mmol/L   Chloride 103 98 - 111 mmol/L   CO2 25 22 - 32 mmol/L   Glucose, Bld 112 (H) 70 - 99 mg/dL    Comment: Glucose reference range applies only to samples taken after fasting for at least 8 hours.   BUN 11 6 - 20 mg/dL   Creatinine, Ser 0.25 0.61 - 1.24 mg/dL   Calcium 9.1 8.9 - 85.2 mg/dL   Total Protein 7.3 6.5 - 8.1 g/dL   Albumin 4.3 3.5 - 5.0 g/dL   AST 27 15 - 41 U/L   ALT 34 0 - 44 U/L   Alkaline Phosphatase 69 38 - 126 U/L   Total Bilirubin 1.3 (H) 0.3 - 1.2 mg/dL   GFR, Estimated >77 >82 mL/min    Comment: (NOTE) Calculated using the CKD-EPI Creatinine Equation (2021)    Anion gap 9 5 - 15    Comment: Performed at [facility], [address] [place], [place] [address]  Lipase, blood     Status: None   Collection Time: 03/29/21  2:47 AM  Result Value Ref Range   Lipase 27 11 - 51 U/L    Comment: Performed at [facility], [address], [place], [place] [address]   CT ABDOMEN PELVIS WO CONTRAST  Result Date: 03/29/2021 CLINICAL DATA:  Lower right abdominal pain. EXAM: CT ABDOMEN AND PELVIS WITHOUT CONTRAST TECHNIQUE: Multidetector CT imaging of the abdomen and pelvis was performed following the standard protocol without IV contrast. COMPARISON:  None. FINDINGS: Lower chest: Dependent atelectasis. Hepatobiliary: Liver is enlarged 18.6 cm craniocaudal length. The liver shows diffusely decreased attenuation suggesting fat deposition. High attenuation material in the gallbladder lumen is more dense than typically seen for sludge and could be vicarious excretion of contrast material the patient has had a recent contrast infused CT. No intrahepatic or extrahepatic biliary dilation. Pancreas: No focal mass lesion. No dilatation of the main duct. No intraparenchymal cyst. No peripancreatic edema. Spleen: No splenomegaly. No focal mass lesion. Adrenals/Urinary Tract: No adrenal nodule or mass. Kidneys unremarkable. No evidence for hydroureter. The urinary bladder appears normal for the degree of distention. Stomach/Bowel: Stomach is unremarkable. No gastric wall thickening. No evidence of outlet obstruction. Duodenum is normally positioned as is the ligament of Treitz. No small bowel wall thickening. No small bowel dilatation. The terminal ileum is normal. Appendix is dilated up to 12 mm diameter with periappendiceal edema/inflammation and probably some trace periappendiceal fluid. No gross colonic mass. No colonic wall thickening. Vascular/Lymphatic: No abdominal aortic aneurysm. No abdominal aortic atherosclerotic calcification. There is no gastrohepatic or hepatoduodenal ligament lymphadenopathy. No  retroperitoneal or mesenteric lymphadenopathy. No pelvic sidewall lymphadenopathy. Reproductive: The prostate gland and seminal vesicles are unremarkable. Other: Trace free fluid around the appendix. Musculoskeletal: No worrisome lytic or sclerotic osseous abnormality. IMPRESSION: 1. Dilated appendix with periappendiceal edema/inflammation and probably some trace periappendiceal fluid. Imaging features are consistent with acute appendicitis. Given the adjacent fluid, perforation cannot be excluded. No evidence for organized abscess. 2. Hepatomegaly with hepatic steatosis. 3. High attenuation material in the gallbladder lumen is more dense than typically seen for sludge and could be vicarious excretion of contrast material the patient has had a recent contrast infused CT. Electronically Signed  By: Kennith Center M.D.   On: 03/29/2021 10:59    Review of Systems  Gastrointestinal:  Positive for abdominal pain, nausea and vomiting.  All other systems reviewed and are negative.  Blood pressure 138/90, pulse 88, temperature 99.5 F (37.5 C), temperature source Oral, resp. rate 16, height 5\' 10"  (1.778 m), weight 83.9 kg, SpO2 97 %. Body mass index is 26.54 kg/m.  Physical Exam Constitutional:      General: He is not in acute distress.    Appearance: He is well-developed.  HENT:     Head: Normocephalic and atraumatic.     Mouth/Throat:     Mouth: Mucous membranes are moist.  Eyes:     General: No scleral icterus. Cardiovascular:     Rate and Rhythm: Normal rate and regular rhythm.  Pulmonary:     Effort: Pulmonary effort is normal. No respiratory distress.  Abdominal:     General: Abdomen is flat.     Palpations: Abdomen is soft.     Tenderness: There is abdominal tenderness in the right lower quadrant. There is guarding. There is no rebound. Positive signs include McBurney's sign. Negative signs include Murphy's sign and Rovsing's sign.  Genitourinary:    Comments: Deferred Skin:     General: Skin is warm and dry.  Neurological:     General: No focal deficit present.     Mental Status: He is alert and oriented to person, place, and time.  Psychiatric:        Mood and Affect: Mood normal.        Behavior: Behavior normal.     Assessment/Plan This is a 30 year old man with a clinical history and physical examination, as well as labs and radiology supporting a diagnosis of acute appendicitis.  I have recommended that he undergo a laparoscopic appendectomy.  The risks of the operation were discussed with the patient and his wife.  These include, but are not limited to, bleeding, infection, damage to surrounding tissues or structures resulting in additional surgery or intervention, wound healing or pain control concerns.  Antibiotics have been initiated.  We will proceed to the operating room pending ORN anesthesia availability.  Anticipate that he will be monitored overnight with likely discharge in the morning.  37, MD 03/29/2021, 1:42 PM

## 2021-03-29 NOTE — ED Notes (Signed)
Called pharmacy. They are mixing ordered flagyl 1500mg  IV. They will send through tube station once ready.

## 2021-03-30 ENCOUNTER — Encounter: Payer: Self-pay | Admitting: General Surgery

## 2021-03-30 LAB — CBC
HCT: 39.4 % (ref 39.0–52.0)
Hemoglobin: 13.3 g/dL (ref 13.0–17.0)
MCH: 29.2 pg (ref 26.0–34.0)
MCHC: 33.8 g/dL (ref 30.0–36.0)
MCV: 86.4 fL (ref 80.0–100.0)
Platelets: 196 10*3/uL (ref 150–400)
RBC: 4.56 MIL/uL (ref 4.22–5.81)
RDW: 13.9 % (ref 11.5–15.5)
WBC: 9.7 10*3/uL (ref 4.0–10.5)
nRBC: 0 % (ref 0.0–0.2)

## 2021-03-30 LAB — BASIC METABOLIC PANEL
Anion gap: 5 (ref 5–15)
BUN: 11 mg/dL (ref 6–20)
CO2: 27 mmol/L (ref 22–32)
Calcium: 8.3 mg/dL — ABNORMAL LOW (ref 8.9–10.3)
Chloride: 105 mmol/L (ref 98–111)
Creatinine, Ser: 0.74 mg/dL (ref 0.61–1.24)
GFR, Estimated: >60 mL/min (ref >60)
Glucose, Bld: 147 mg/dL — ABNORMAL HIGH (ref 70–99)
Potassium: 3.9 mmol/L (ref 3.5–5.1)
Sodium: 137 mmol/L (ref 135–145)

## 2021-03-30 LAB — MAGNESIUM: Magnesium: 1.9 mg/dL (ref 1.7–2.4)

## 2021-03-30 LAB — PHOSPHORUS: Phosphorus: 2.6 mg/dL (ref 2.5–4.6)

## 2021-03-30 MED ORDER — IBUPROFEN 800 MG PO TABS: 800.0000 mg | ORAL_TABLET | Freq: Three times a day (TID) | ORAL | 0 refills | Status: DC | PRN

## 2021-03-30 MED ORDER — OXYCODONE HCL 5 MG PO TABS: 5.0000 mg | ORAL_TABLET | ORAL | 0 refills | Status: DC | PRN

## 2021-03-30 MED ORDER — ACETAMINOPHEN 500 MG PO TABS: 1000.0000 mg | ORAL_TABLET | Freq: Four times a day (QID) | ORAL | 0 refills | Status: DC

## 2021-03-30 MED ORDER — TAMSULOSIN HCL 0.4 MG PO CAPS
0.4000 mg | ORAL_CAPSULE | Freq: Every day | ORAL | Status: DC
  Filled 2021-03-30: qty 1

## 2021-03-30 NOTE — Progress Notes (Signed)
Pt c/o difficulty urinating. States he attempted x 2 to empty his bladder but could not start a stream. Bladder scan obtained, 656 ml in bladder. Provider notified, order for flomax obtained and advised to straight cath x 1. RN explained catheterization to pt and encouraged pt to go into restroom and empty bladder in natural position for comfort. Pt able to urinate 800 ml in urinal. Will pass info to day shift for monitoring.

## 2021-03-30 NOTE — Discharge Summary (Signed)
Physician Discharge Summary  Patient ID: Keith Cortez MRN: [id number] DOB/AGE: 1991-07-26 30 y.o.  Admit date: 03/29/2021 Discharge date: 03/30/2021  Admission Diagnoses: Acute appendicitis  Discharge Diagnoses:  Active Problems:   Appendicitis   Acute appendicitis   S/P laparoscopic appendectomy   Discharged Condition: good  Hospital Course: Patient presented to the emergency room with roughly 24 hours history of abdominal pain, nausea, and vomiting.  ER evaluation was consistent with acute appendicitis.  He was taken to the operating room where he underwent an uncomplicated laparoscopic appendectomy.  He was monitored overnight for pain control and diet tolerance.  He met all desired parameters and was felt suitable for discharge.  Consults: None  Significant Diagnostic Studies: radiology: CT scan: Consistent with acute appendicitis  Treatments: IV hydration, antibiotics: ceftriaxone and metronidazole, analgesia: Multimodal analgesia, and surgery: Laparoscopic appendectomy  Discharge Exam: Blood pressure 117/66, pulse 66, temperature 98.2 F (36.8 C), temperature source Oral, resp. rate 18, height 5' 10"  (1.778 m), weight 83.9 kg, SpO2 97 %. General appearance: alert, cooperative, and no distress Resp: Normal work of breathing on room air Cardio: regular rate and rhythm Incision/Wound: Laparoscopic incisions are covered with Steri-Strips.  No erythema, induration, or drainage present.  Disposition: Discharge disposition: 01-Home or Self Care       Discharge Instructions     Call MD for:  difficulty breathing, headache or visual disturbances   Complete by: As directed    Call MD for:  extreme fatigue   Complete by: As directed    Call MD for:  hives   Complete by: As directed    Call MD for:  persistant dizziness or light-headedness   Complete by: As directed    Call MD for:  persistant nausea and vomiting   Complete by: As directed    Call MD for:  redness,  tenderness, or signs of infection (pain, swelling, redness, odor or green/yellow discharge around incision site)   Complete by: As directed    Call MD for:  severe uncontrolled pain   Complete by: As directed    Call MD for:  temperature >100.4   Complete by: As directed    Diet - low sodium heart healthy   Complete by: As directed    Discharge wound care:   Complete by: As directed    You may shower starting on Monday.  Do not submerge the incisions or allow them to become saturated with water or sweat.  It is okay if they get a little bit damp; just pat them dry gently with a clean soft towel.  Allow the Steri-Strips (paper tapes) to fall off on their own.   Driving Restrictions   Complete by: As directed    No driving for 1 week or while taking narcotic pain medication.   Increase activity slowly   Complete by: As directed    Lifting restrictions   Complete by: As directed    Do not lift, push, or pull anything heavier than 10 pounds for 3 weeks.      Allergies as of 03/30/2021   No Known Allergies      Medication List     TAKE these medications    acetaminophen 500 MG tablet Commonly known as: TYLENOL Take 2 tablets (1,000 mg total) by mouth every 6 (six) hours.   ibuprofen 800 MG tablet Commonly known as: ADVIL Take 1 tablet (800 mg total) by mouth every 8 (eight) hours as needed.   oxyCODONE 5 MG immediate release tablet  Commonly known as: Oxy IR/ROXICODONE Take 1 tablet (5 mg total) by mouth every 4 (four) hours as needed for moderate pain or severe pain.               Discharge Care Instructions  (From admission, onward)           Start     Ordered   03/30/21 0000  Discharge wound care:       Comments: You may shower starting on Monday.  Do not submerge the incisions or allow them to become saturated with water or sweat.  It is okay if they get a little bit damp; just pat them dry gently with a clean soft towel.  Allow the Steri-Strips (paper tapes)  to fall off on their own.   03/30/21 1620            Follow-up Information     Tylene Fantasia, PA-C. Schedule an appointment as soon as possible for a visit in 2 week(s).   Specialty: Physician Assistant Contact information: [address] [place] [address] [phone]                 Signed: Fredirick Maudlin 03/30/2021, 4:21 PM

## 2021-03-30 NOTE — Progress Notes (Signed)
Discharge instructions given to and reviewed with patient. Patient verbalized understanding of all discharge instructions including to call MD office for follow up appointment, and discussed new prescriptions. Patient tolerated diet well today and just finished dinner. Denies pain. Surgical sites to abdomen WDL. Patient left unit ambulating with wife and RN at side. Discharged home with wife driving.

## 2021-04-02 LAB — SURGICAL PATHOLOGY

## 2021-04-08 ENCOUNTER — Encounter: Payer: BC Managed Care – PPO | Admitting: Physician Assistant

## 2021-04-10 ENCOUNTER — Ambulatory Visit (INDEPENDENT_AMBULATORY_CARE_PROVIDER_SITE_OTHER): Payer: BC Managed Care – PPO | Admitting: Physician Assistant

## 2021-04-10 ENCOUNTER — Other Ambulatory Visit: Payer: Self-pay

## 2021-04-10 ENCOUNTER — Encounter: Payer: Self-pay | Admitting: Physician Assistant

## 2021-04-10 VITALS — BP 127/86 | HR 115 | Temp 98.1°F | Ht 69.0 in | Wt 182.8 lb

## 2021-04-10 DIAGNOSIS — Z09 Encounter for follow-up examination after completed treatment for conditions other than malignant neoplasm: Secondary | ICD-10-CM

## 2021-04-10 DIAGNOSIS — K353 Acute appendicitis with localized peritonitis, without perforation or gangrene: Secondary | ICD-10-CM

## 2021-04-10 NOTE — Progress Notes (Signed)
[facility] POST-OP OFFICE VISIT  04/10/2021  HPI: Keith Cortez is a 30 y.o. male 12 days s/p laparoscopic appendectomy for appendicitis with Dr Lady Gary.   He has done very well since surgery.  Initially had some constipation, but this resolved No issues with abdominal pain, nausea, emesis, or bowel changes He is tolerating PO No issues with incision No other complaints  Vital signs: BP 127/86   Pulse (!) 115   Temp 98.1 F (36.7 C) (Oral)   Ht 5\' 9"  (1.753 m)   Wt 182 lb 12.8 oz (82.9 kg)   SpO2 97%   BMI 26.99 kg/m    Physical Exam: Constitutional: Well appearing male, NAD Abdomen: Soft, non-tender, non-distended, no rebound/guarding Skin: Laparoscopic incisions are well healed, no erythema or drainage   Assessment/Plan: This is a 30 y.o. male 12 days s/p laparoscopic appendectomy for appendicitis.    - Pain control prn; OTC medications  - Reviewed wound care  - Reviewed lifting restrictions; 4 weeks total; work note provided  - Pathology: Acute gangrenous appendicitis, negative for malignancy   - He can rtc on as needed basis   -- 37, PA-C [facility] 04/10/2021, 1:48 PM [phone] M-F: 7am - 4pm

## 2021-04-10 NOTE — Patient Instructions (Signed)

## 2021-06-17 ENCOUNTER — Encounter: Payer: Self-pay | Admitting: General Surgery

## 2022-04-15 IMAGING — CT CT ABD-PELV W/O CM
2 of 4 series · 15 of 46 positions shown, 17 images · non-contrast
Comparison: None.

CLINICAL DATA: Lower right abdominal pain.

EXAM:
CT ABDOMEN AND PELVIS WITHOUT CONTRAST
TECHNIQUE: Multidetector CT imaging of the abdomen and pelvis was performed
following the standard protocol without IV contrast.

[Series 2: routine abd/pel wo · axial · 0.84mm/px · z∈[-538,-78]mm · 12 of 102 slices shown, 14 images]
[im 5/102  soft-tissue]
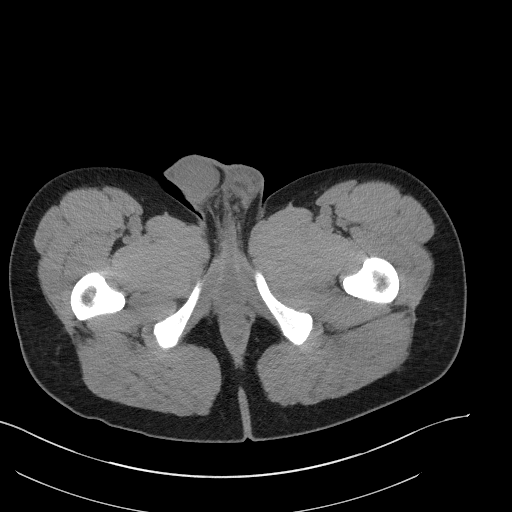
[im 5/102  bone]
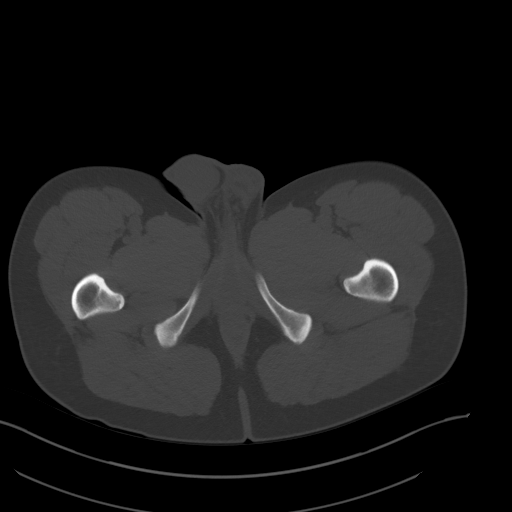
[im 13/102  soft-tissue]
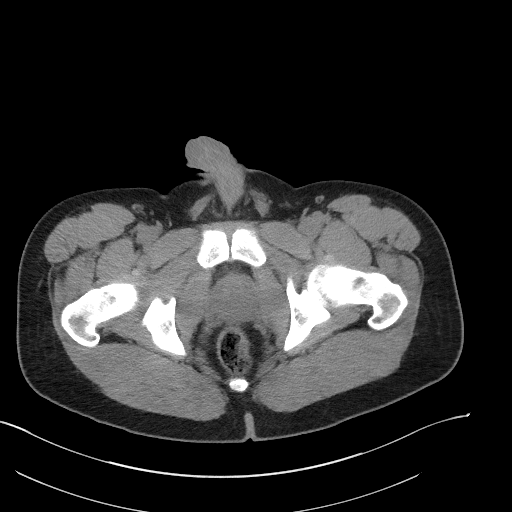
[im 22/102  soft-tissue]
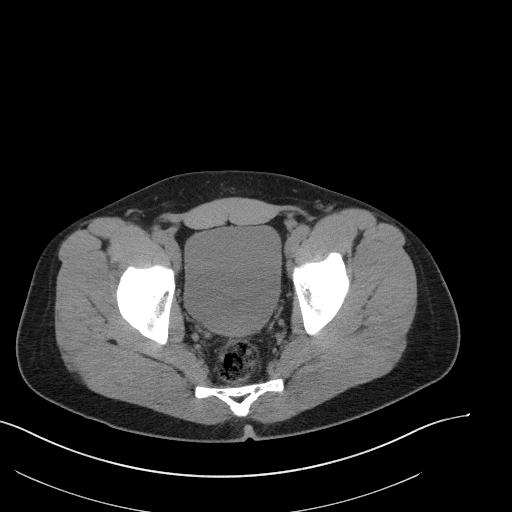
[im 30/102  soft-tissue]
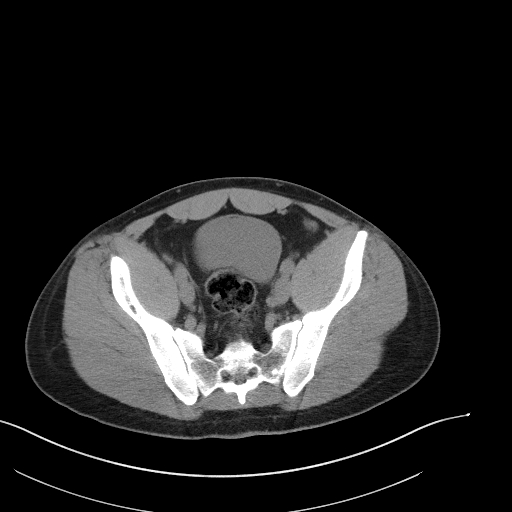
[im 38/102  soft-tissue]
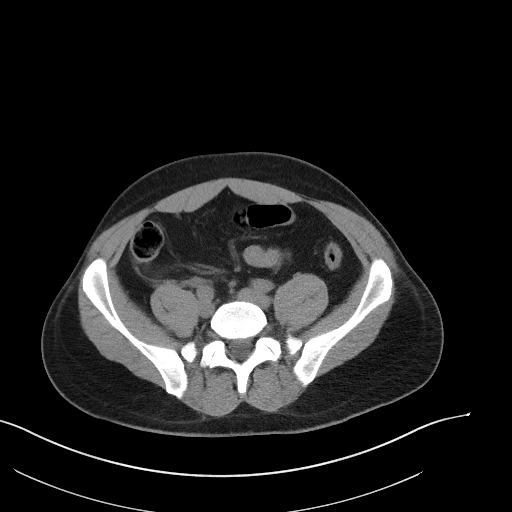
[im 47/102  soft-tissue]
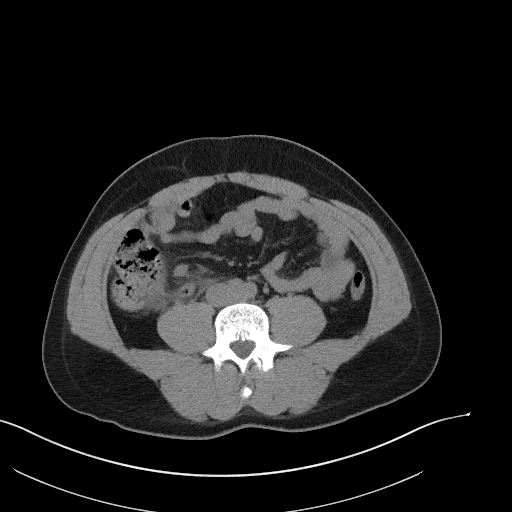
[im 55/102  soft-tissue]
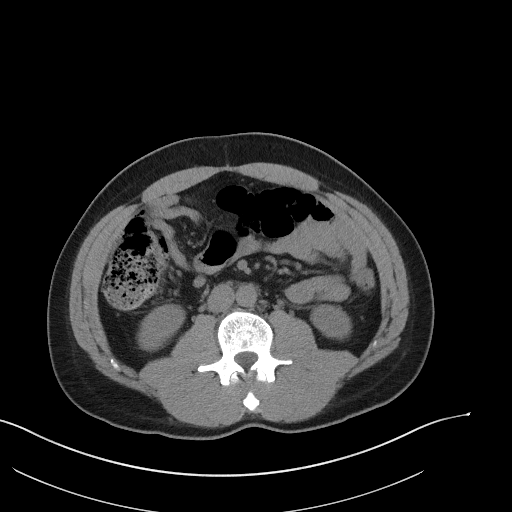
[im 64/102  soft-tissue]
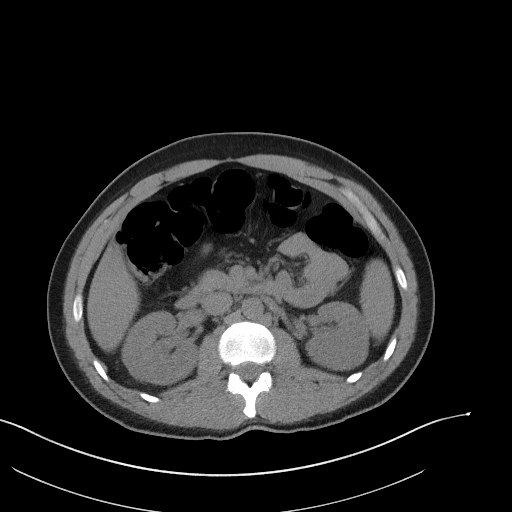
[im 72/102  soft-tissue]
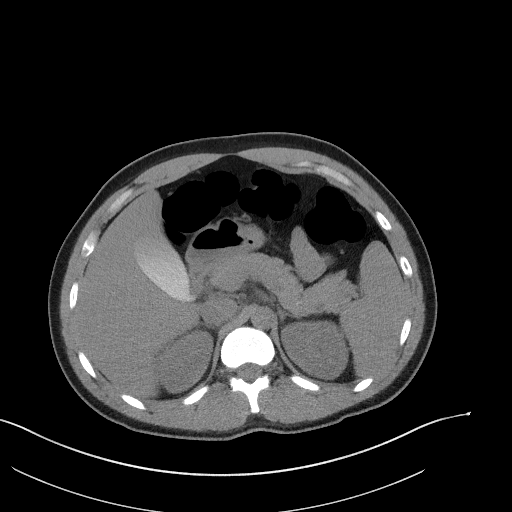
[im 72/102  bone]
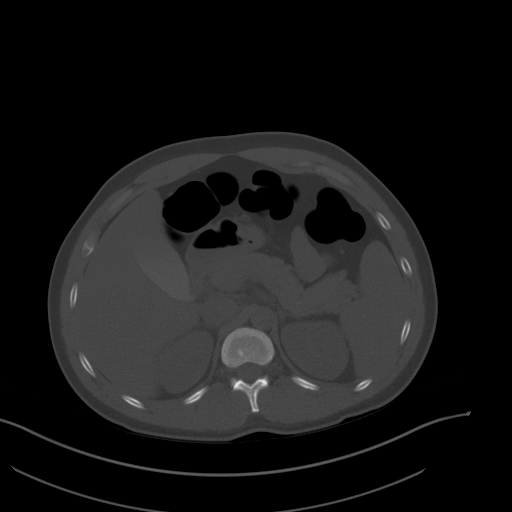
[im 80/102  soft-tissue]
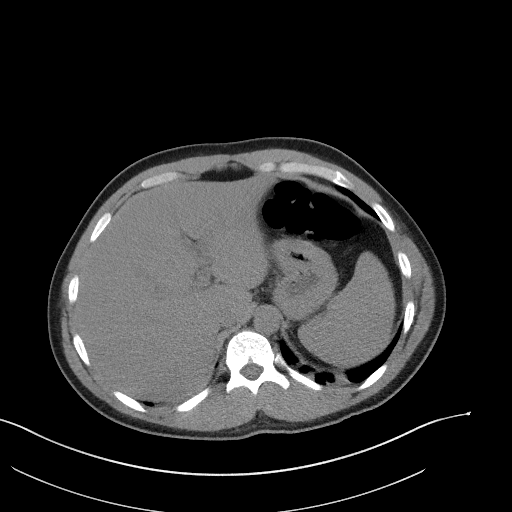
[im 89/102  soft-tissue]
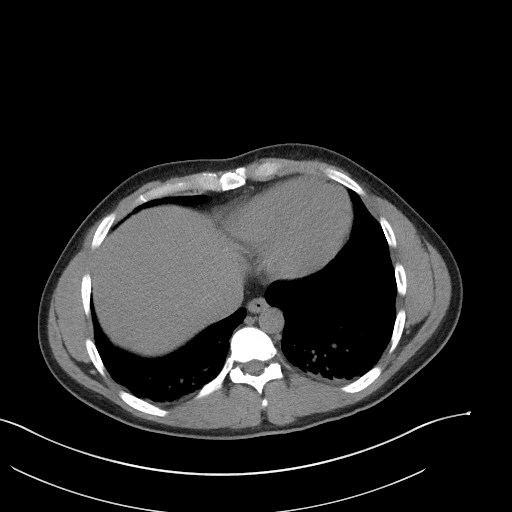
[im 97/102  soft-tissue]
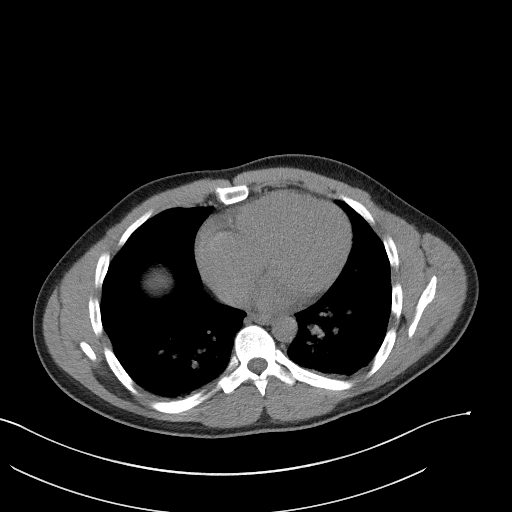

[Series 5: coronal st · coronal · 0.66mm/px · 3 of 88 slices shown]
[im 30/88  soft-tissue]
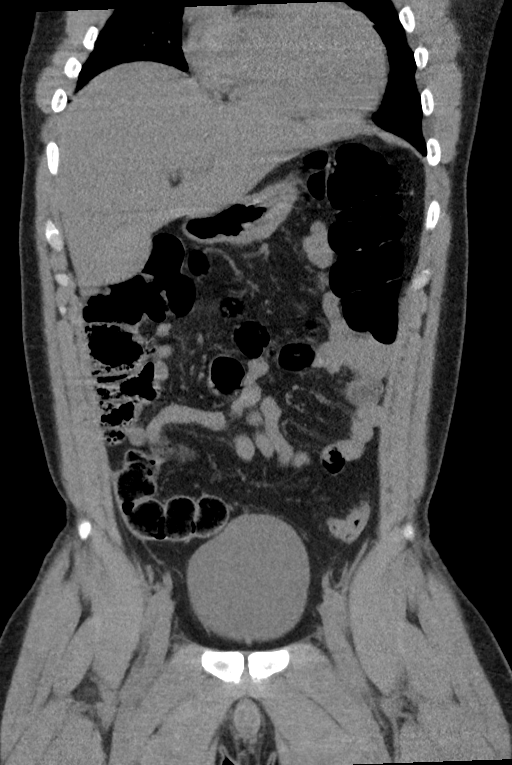
[im 39/88  soft-tissue]
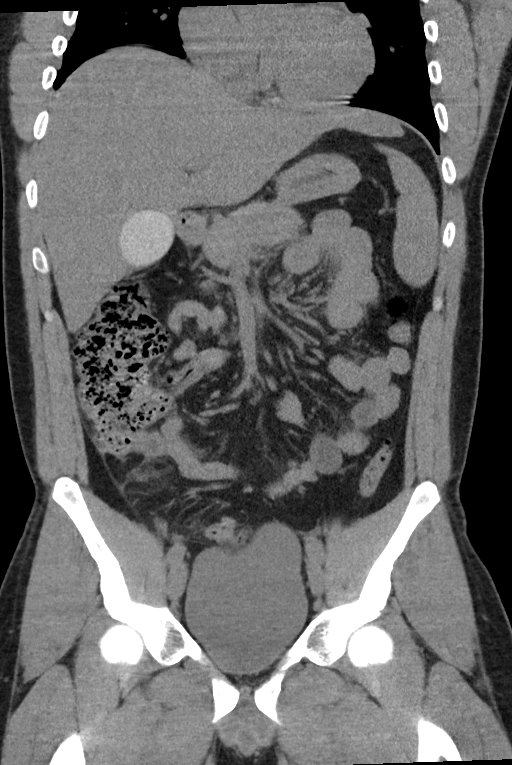
[im 49/88  soft-tissue]
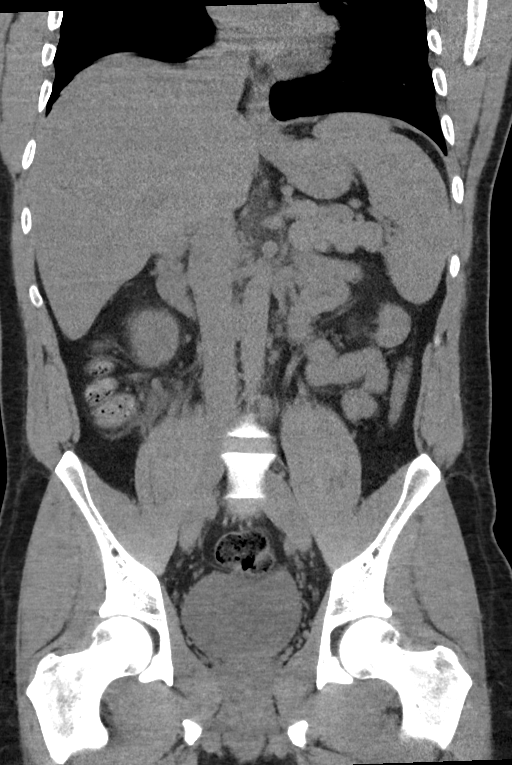

[15 of 46 positions shown; findings below may reference images not displayed]

FINDINGS: Lower chest: Dependent atelectasis.

Hepatobiliary: Liver is enlarged 18.6 cm craniocaudal length. The
liver shows diffusely decreased attenuation suggesting fat
deposition. High attenuation material in the gallbladder lumen is
more dense than typically seen for sludge and could be vicarious
excretion of contrast material the patient has had a recent contrast
infused CT. No intrahepatic or extrahepatic biliary dilation.

Pancreas: No focal mass lesion. No dilatation of the main duct. No
intraparenchymal cyst. No peripancreatic edema.

Spleen: No splenomegaly. No focal mass lesion.

Adrenals/Urinary Tract: No adrenal nodule or mass. Kidneys
unremarkable. No evidence for hydroureter. The urinary bladder
appears normal for the degree of distention.

Stomach/Bowel: Stomach is unremarkable. No gastric wall thickening.
No evidence of outlet obstruction. Duodenum is normally positioned
as is the ligament of Treitz. No small bowel wall thickening. No
small bowel dilatation. The terminal ileum is normal. Appendix is
dilated up to 12 mm diameter with periappendiceal edema/inflammation
and probably some trace periappendiceal fluid. No gross colonic
mass. No colonic wall thickening.

Vascular/Lymphatic: No abdominal aortic aneurysm. No abdominal
aortic atherosclerotic calcification. There is no gastrohepatic or
hepatoduodenal ligament lymphadenopathy. No retroperitoneal or
mesenteric lymphadenopathy. No pelvic sidewall lymphadenopathy.

Reproductive: The prostate gland and seminal vesicles are
unremarkable.

Other: Trace free fluid around the appendix.

Musculoskeletal: No worrisome lytic or sclerotic osseous
abnormality.
IMPRESSION: 1. Dilated appendix with periappendiceal edema/inflammation and
probably some trace periappendiceal fluid. Imaging features are
consistent with acute appendicitis. Given the adjacent fluid,
perforation cannot be excluded. No evidence for organized abscess.
2. Hepatomegaly with hepatic steatosis.
3. High attenuation material in the gallbladder lumen is more dense
than typically seen for sludge and could be vicarious excretion of
contrast material the patient has had a recent contrast infused CT.

## 2022-08-18 ENCOUNTER — Encounter: Payer: Self-pay | Admitting: General Practice

## 2022-09-03 ENCOUNTER — Ambulatory Visit: Payer: Self-pay | Admitting: Family Medicine

## 2022-11-03 ENCOUNTER — Encounter: Payer: Self-pay | Admitting: Family Medicine

## 2022-11-03 ENCOUNTER — Ambulatory Visit: Payer: BC Managed Care – PPO | Admitting: Family Medicine

## 2022-11-03 VITALS — BP 128/78 | HR 62 | Ht 69.0 in | Wt 186.0 lb

## 2022-11-03 DIAGNOSIS — Z23 Encounter for immunization: Secondary | ICD-10-CM | POA: Insufficient documentation

## 2022-11-03 DIAGNOSIS — M25373 Other instability, unspecified ankle: Secondary | ICD-10-CM | POA: Diagnosis not present

## 2022-11-03 MED ORDER — DICLOFENAC SODIUM 50 MG PO TBEC: 50.0000 mg | DELAYED_RELEASE_TABLET | Freq: Two times a day (BID) | ORAL | 0 refills | Status: DC | PRN

## 2022-11-03 NOTE — Patient Instructions (Signed)
-   Start home exercises on a regular basis until follow-up  - Can use "ASO brace" during sports, while symptomatic, and for anticipated prolonged time on your feet - Use Rx antiinflammatory as-needed for pain control - Return in 6 weeks for physical

## 2022-11-03 NOTE — Progress Notes (Signed)
     Primary Care / Sports Medicine Office Visit  Patient Information:  Patient ID: Keith Cortez, male DOB: [birth year] Age: 32 y.o. MRN: [id number]   Keith Cortez is a pleasant 32 y.o. male presenting with the following:  Chief Complaint  Patient presents with   Establish Care    Vitals:   11/03/22 0857  BP: 128/78  Pulse: 62  SpO2: 98%   Vitals:   11/03/22 0857  Weight: 186 lb (84.4 kg)  Height: 5\' 9"  (1.753 m)   Body mass index is 27.47 kg/m.  No results found.   Independent interpretation of notes and tests performed by another provider:   None  Procedures performed:   None  Pertinent History, Exam, Impression, and Recommendations:   Keith Cortez was seen today for establish care.  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  Has had multiple ankle sprains since that time.  He has sought medical attention for some of these episodes, no ankle fracture history, has had PT for this issue with some improvement.  Describes daily AM stiffness that resolves with mobility and compression, ankle fatigue and instability after athletics (end of basketball game), and notes sporadic sensation of "ankle wanting to give out".  Examination with no abnormalities to inspection bilaterally, full painless range of motion, mild tenderness at the anterolateral talar dome/lateral malleolus medially, increased laxity with anterior drawer, otherwise negative talar tilt, bilateral decreased proprioception noted.  Patient's clinical history and findings raise concern for ankle instability in the setting of multiple inversion injuries, cannot exclude element of chondral involvement giving the a.m. stiffness described.  Plan for dedicated home-based rehab with a focus on proprioception, as needed usage of ASO brace and diclofenac.  We will reevaluate him at his return in 6 weeks.   Further progression of evaluation and therapy can include PT, advanced imaging, medication management.  Orders: -     Diclofenac Sodium; Take 1 tablet (50 mg total) by mouth 2 (two) times daily as needed.  Dispense: 60 tablet; Refill: 0  Need for immunization against influenza -     Flu Vaccine QUAD 70mo+IM (Fluarix, Fluzone & Alfiuria Quad PF)     Orders & Medications Meds ordered this encounter  Medications   diclofenac (VOLTAREN) 50 MG EC tablet    Sig: Take 1 tablet (50 mg total) by mouth 2 (two) times daily as needed.    Dispense:  60 tablet    Refill:  0   Orders Placed This Encounter  Procedures   Flu Vaccine QUAD 27mo+IM (Fluarix, Fluzone & Alfiuria Quad PF)     Return in about 6 weeks (around 12/15/2022) for CPE.     Keith Culver, MD, [facility]   Primary Care Sports Medicine Primary Care and Sports Medicine at [facility] [place]

## 2022-11-03 NOTE — Assessment & Plan Note (Signed)
Chronic condition, traumatic onset during college years after bad inversion injury involving swelling and ecchymosis.  Has had multiple ankle sprains since that time.  He has sought medical attention for some of these episodes, no ankle fracture history, has had PT for this issue with some improvement.  Describes daily AM stiffness that resolves with mobility and compression, ankle fatigue and instability after athletics (end of basketball game), and notes sporadic sensation of "ankle wanting to give out".  Examination with no abnormalities to inspection bilaterally, full painless range of motion, mild tenderness at the anterolateral talar dome/lateral malleolus medially, increased laxity with anterior drawer, otherwise negative talar tilt, bilateral decreased proprioception noted.  Patient's clinical history and findings raise concern for ankle instability in the setting of multiple inversion injuries, cannot exclude element of chondral involvement giving the a.m. stiffness described.  Plan for dedicated home-based rehab with a focus on proprioception, as needed usage of ASO brace and diclofenac.  We will reevaluate him at his return in 6 weeks.  Further progression of evaluation and therapy can include PT, advanced imaging, medication management.

## 2022-12-21 ENCOUNTER — Encounter: Payer: Self-pay | Admitting: Family Medicine

## 2022-12-21 ENCOUNTER — Ambulatory Visit (INDEPENDENT_AMBULATORY_CARE_PROVIDER_SITE_OTHER): Payer: BC Managed Care – PPO | Admitting: Family Medicine

## 2022-12-21 VITALS — BP 112/72 | HR 62 | Ht 70.0 in | Wt 185.0 lb

## 2022-12-21 DIAGNOSIS — Z1159 Encounter for screening for other viral diseases: Secondary | ICD-10-CM | POA: Diagnosis not present

## 2022-12-21 DIAGNOSIS — Z0000 Encounter for general adult medical examination without abnormal findings: Secondary | ICD-10-CM | POA: Insufficient documentation

## 2022-12-21 DIAGNOSIS — Z13220 Encounter for screening for lipoid disorders: Secondary | ICD-10-CM

## 2022-12-21 DIAGNOSIS — Z114 Encounter for screening for human immunodeficiency virus [HIV]: Secondary | ICD-10-CM | POA: Diagnosis not present

## 2022-12-21 DIAGNOSIS — E559 Vitamin D deficiency, unspecified: Secondary | ICD-10-CM

## 2022-12-21 DIAGNOSIS — Z23 Encounter for immunization: Secondary | ICD-10-CM | POA: Diagnosis not present

## 2022-12-21 NOTE — Patient Instructions (Signed)
-   Obtain fasting labs with orders provided (can have water or black coffee but otherwise no food or drink x 8 hours before labs) - Review information provided - Attend eye doctor annually, dentist every 6 months, work towards or maintain 30 minutes of moderate intensity physical activity at least 5 days per week, and consume a balanced diet - Return in 1 year for physical - Contact us for any questions between now and then 

## 2022-12-21 NOTE — Progress Notes (Signed)
Annual Physical Exam Visit  Patient Information:  Patient ID: UTKARSH Cortez, male DOB: April 11, 1991 Age: 32 y.o. MRN: [id number]   Subjective:   CC: Annual Physical Exam  HPI:  Keith Cortez is here for their annual physical.  I reviewed the past medical history, family history, social history, surgical history, and allergies today and changes were made as necessary.  Please see the problem list section below for additional details.  Past Medical History: History reviewed. No pertinent past medical history. Past Surgical History: Past Surgical History:  Procedure Laterality Date   LAPAROSCOPIC APPENDECTOMY N/A 03/29/2021   Procedure: APPENDECTOMY LAPAROSCOPIC;  Surgeon: Fredirick Maudlin, MD;  Location: [facility] ORS;  Service: General;  Laterality: N/A;   Family History: Family History  Problem Relation Age of Onset   Hypertension Mother    Cancer Father        prostate   Diabetes Brother    Allergies: No Known Allergies Health Maintenance: Health Maintenance  Topic Date Due   COVID-19 Vaccine (1) Never done   Hepatitis C Screening  Never done   DTaP/Tdap/Td (2 - Td or Tdap) 12/20/2032   INFLUENZA VACCINE  Completed   HIV Screening  Completed   HPV VACCINES  Aged Out    HM Colonoscopy     This patient has no relevant Health Maintenance data.      Medications: No current outpatient medications on file prior to visit.   No current facility-administered medications on file prior to visit.    Review of Systems: No headache, visual changes, nausea, vomiting, diarrhea, constipation, dizziness, abdominal pain, skin rash, fevers, chills, night sweats, swollen lymph nodes, weight loss, chest pain, body aches, joint swelling, muscle aches, shortness of breath, mood changes, visual or auditory hallucinations reported.  Objective:   Vitals:   12/21/22 0842  BP: 112/72  Pulse: 62  SpO2: 98%   Vitals:   12/21/22 0842  Weight: 185 lb (83.9 kg)  Height: 5\' 10"  (1.778 m)    Body mass index is 26.54 kg/m.  General: Well Developed, well nourished, and in no acute distress.  Neuro: Alert and oriented x3, extra-ocular muscles intact, sensation grossly intact. Cranial nerves II through XII are grossly intact, motor, sensory, and coordinative functions are intact. HEENT: Normocephalic, atraumatic, pupils equal round reactive to light, neck supple, no masses, no lymphadenopathy, thyroid nonpalpable. Oropharynx, nasopharynx, external ear canals are unremarkable. Skin: Warm and dry, no rashes noted.  Cardiac: Regular rate and rhythm, no murmurs rubs or gallops. No peripheral edema. Pulses symmetric. Respiratory: Clear to auscultation bilaterally. Not using accessory muscles, speaking in full sentences.  Abdominal: Soft, nontender, nondistended, positive bowel sounds, no masses, no organomegaly. Musculoskeletal: Shoulder, elbow, wrist, hip, knee, ankle stable, and with full range of motion.   Impression and Recommendations:   The patient was counselled, risk factors were discussed, and anticipatory guidance given.  Problem List Items Addressed This Visit       Other   Annual physical exam - Primary    Annual examination completed, risk stratification labs ordered, anticipatory guidance provided.  We will follow labs once resulted.      Relevant Orders   CBC   Comprehensive metabolic panel   Hepatitis C antibody   HIV Antibody (routine testing w rflx)   Lipid panel   TSH   VITAMIN D 25 Hydroxy (Vit-D Deficiency, Fractures)   Need for Tdap vaccination    Tdap administered today.      Relevant Orders  Tdap vaccine greater than or equal to 7yo IM (Completed)   Other Visit Diagnoses     Screening for HIV (human immunodeficiency virus)       Relevant Orders   HIV Antibody (routine testing w rflx)   Need for hepatitis C screening test       Relevant Orders   Hepatitis C antibody   Screening for lipoid disorders       Relevant Orders   Comprehensive  metabolic panel   Lipid panel   Vitamin D deficiency       Relevant Orders   VITAMIN D 25 Hydroxy (Vit-D Deficiency, Fractures)   Healthcare maintenance       Relevant Orders   CBC   Comprehensive metabolic panel   Hepatitis C antibody   HIV Antibody (routine testing w rflx)   Lipid panel   TSH   VITAMIN D 25 Hydroxy (Vit-D Deficiency, Fractures)        Orders & Medications Medications: No orders of the defined types were placed in this encounter.  Orders Placed This Encounter  Procedures   Tdap vaccine greater than or equal to 7yo IM   CBC   Comprehensive metabolic panel   Hepatitis C antibody   HIV Antibody (routine testing w rflx)   Lipid panel   TSH   VITAMIN D 25 Hydroxy (Vit-D Deficiency, Fractures)     Return in about 1 year (around 12/21/2023) for CPE.    [name], MD, [facility]   Primary Care Sports Medicine [facility]

## 2022-12-21 NOTE — Assessment & Plan Note (Signed)
Tdap administered today  

## 2022-12-21 NOTE — Assessment & Plan Note (Signed)
Annual examination completed, risk stratification labs ordered, anticipatory guidance provided.  We will follow labs once resulted. 

## 2023-12-08 DIAGNOSIS — J0100 Acute maxillary sinusitis, unspecified: Secondary | ICD-10-CM | POA: Diagnosis not present

## 2023-12-22 ENCOUNTER — Encounter: Payer: Self-pay | Admitting: Family Medicine
